# Patient Record
Sex: Female | Born: 1975 | Race: Black or African American | Hispanic: No | Marital: Single
Health system: Midwestern US, Academic
[De-identification: ages and names within clinical notes are randomized; demographics above are authoritative.]

---

## 2018-05-17 LAB — COMPREHENSIVE METABOLIC PANEL
Lab: 0.3
Lab: 1
Lab: 12
Lab: 139
Lab: 15
Lab: 23
Lab: 3.6
Lab: 60
Lab: 7.4
Lab: 79
Lab: 9.5
Lab: 93

## 2018-05-17 LAB — URIC ACID: Lab: 4.9

## 2018-05-17 LAB — C REACTIVE PROTEIN (CRP): Lab: 1.3 — ABNORMAL HIGH (ref 0.10–0.30)

## 2018-05-17 LAB — CBC: Lab: 8.3

## 2018-05-17 LAB — THYROID STIMULATING HORMONE-TSH: Lab: 1.1

## 2018-05-18 LAB — SED RATE: Lab: 59 — ABNORMAL HIGH (ref 0–20)

## 2018-05-18 LAB — D-DIMER: Lab: 532 — ABNORMAL HIGH (ref 0–400)

## 2018-05-18 LAB — ANTI-NUCLEAR ANTIBODY(ANA)
Lab: 1:8 {titer} — ABNORMAL HIGH
Lab: POSITIVE — AB

## 2018-05-18 LAB — C REACTIVE PROT-HI SENSITIVITY: Lab: 1 — ABNORMAL HIGH (ref 0.10–0.30)

## 2018-05-23 ENCOUNTER — Encounter: Admit: 2018-05-23 | Discharge: 2018-05-23 | Payer: BC Managed Care – PPO

## 2018-05-23 DIAGNOSIS — I10 Essential (primary) hypertension: ICD-10-CM

## 2018-05-23 DIAGNOSIS — R600 Localized edema: Principal | ICD-10-CM

## 2018-05-23 DIAGNOSIS — R0602 Shortness of breath: ICD-10-CM

## 2018-05-24 ENCOUNTER — Encounter: Admit: 2018-05-24 | Discharge: 2018-05-24 | Payer: BC Managed Care – PPO

## 2018-05-24 ENCOUNTER — Ambulatory Visit: Admit: 2018-05-24 | Discharge: 2018-05-25 | Payer: BC Managed Care – PPO

## 2018-05-24 DIAGNOSIS — R0602 Shortness of breath: Principal | ICD-10-CM

## 2018-05-24 DIAGNOSIS — I10 Essential (primary) hypertension: ICD-10-CM

## 2018-05-24 DIAGNOSIS — R600 Localized edema: ICD-10-CM

## 2018-05-24 NOTE — Progress Notes
Date of Service: 05/24/2018    Denise Wood is a 43 y.o. female.       HPI     I saw Denise Wood today regarding her exertional dyspnea.  She has had an extensive workup including an echocardiogram that was negative for pathology.  She had a venous duplex that was negative, and she even had a CT of the chest that showed some nodules, but no other abnormality.  She is under treatment for hypertension.  They did note on the CT that she had marked adenopathy in the mediastinum and hila, several scattered parenchymal nodules.  They even stated in their report that the possibilities for diagnosis would include lymphoma, infection, inflammatory processes.  I believe she is under investigation for that by her primary physician.     We are going to do a simple exercise stress test in regard to her findings, but I suspect they are not cardiac related unless her echocardiogram, under our auspices, finds something different than her previous study.  It is mostly this treadmill stress that I am most interested in, but many women will have a falsely positive stress electrocardiogram, and that is why we want to do it with echo imaging.  Her echo Doppler earlier with the [facility] doctors showed normal diastolic function, normal systolic function.  There was no estimation of pulmonary artery pressure.  There was no tricuspid valve jet.  The inferior vena cava though was normal by their report.     She is not having chest pain otherwise per se, but some of her symptoms appear to be both recumbent and exertional.  We will do the stress echo.  If that is negative, we would probably urge that her evaluation center on the lymphadenopathy and pulmonary nodules as stated on her CT scan.    ([id number])             Vitals:    05/24/18 1458 05/24/18 1506   BP: (!) 138/94 (!) 146/100   BP Source: Arm, Left Upper Arm, Right Upper   Pulse: 102    SpO2: 98%    Weight: 118.9 kg (262 lb 3.2 oz)    Height: 1.753 m (5' 9)    PainSc: Zero Body mass index is 38.72 kg/m???.     Past Medical History  Patient Active Problem List    Diagnosis Date Noted   ??? Lower extremity edema 05/23/2018     05/18/2018 Bil venous duplex  There is no evidence of deep venous thrombosis in either lower extremity      ??? Hypertension 05/23/2018     317/2020  Echo Normal LV systolic diastolic function.  No color flow or doppler evidence of hemodynamically significant valvular dysfunction.  Normal estimated PA pressure. IVC size and respiratory response are consistent with normal central venous pressure.      ??? Shortness of breath 05/23/2018         Review of Systems   Constitution: Negative.   HENT: Negative.    Eyes: Negative.    Cardiovascular: Positive for claudication, dyspnea on exertion and leg swelling.   Respiratory: Positive for shortness of breath.    Endocrine: Positive for polyuria.   Hematologic/Lymphatic: Negative.    Skin: Negative.    Musculoskeletal: Negative.    Gastrointestinal: Negative.    Genitourinary: Positive for frequency.   Neurological: Negative.    Psychiatric/Behavioral: Negative.    Allergic/Immunologic: Negative.        Physical Exam  Examination reveals a somewhat obese  lady in no acute distress.  She has no rhonchi, wheezing.  Jugular venous distention is normal.  Her ambulatory capability is normal.  She has only trace sock edema at the ankles, otherwise none.  Her balance is good.  There is no arcus senilis.  No cyanosis or clubbing.  No rashes.  Although she has been treated in the past for Graves disease, she is currently off medication.  She is also being evaluated for possible lupus or rheumatoid arthritis.    ([id number])        Cardiovascular Studies  Her 12-lead EKG shows normal sinus rhythm, borderline T-wave abnormalities, no infarction pattern, nondiagnostic Q-waves in the inferior leads.  PR interval is 164 milliseconds, heart rate 102 beats per minute.  Blood pressure is mildly elevated.  She is on hydrochlorothiazide

## 2018-08-01 ENCOUNTER — Encounter: Admit: 2018-08-01 | Discharge: 2018-08-01 | Payer: BC Managed Care – PPO

## 2018-08-01 DIAGNOSIS — R0602 Shortness of breath: Principal | ICD-10-CM

## 2018-08-01 DIAGNOSIS — R079 Chest pain, unspecified: ICD-10-CM

## 2018-08-10 ENCOUNTER — Encounter: Admit: 2018-08-10 | Discharge: 2018-08-10

## 2018-08-10 ENCOUNTER — Ambulatory Visit: Admit: 2018-08-10 | Discharge: 2018-08-11

## 2018-08-10 DIAGNOSIS — R0602 Shortness of breath: Secondary | ICD-10-CM

## 2018-08-10 DIAGNOSIS — R0789 Other chest pain: Secondary | ICD-10-CM

## 2018-08-16 ENCOUNTER — Encounter: Admit: 2018-08-16 | Discharge: 2018-08-16

## 2018-08-16 NOTE — Telephone Encounter
-----   Message from Paticia Stack, RN sent at 08/16/2018 10:12 AM CDT -----    ----- Message -----  From: Shan Levans, MD  Sent: 08/16/2018  10:02 AM CDT  To: Binnie Kand, RN    Please let pt know this test is normal.

## 2018-08-16 NOTE — Telephone Encounter
Results and recommendations called to patient lmom requested call back if questions

## 2018-08-16 NOTE — Telephone Encounter
-----   Message from Laura Knippa, RN sent at 08/16/2018 10:12 AM CDT -----    ----- Message -----  From: Rosamond, Thomas L, MD  Sent: 08/16/2018  10:02 AM CDT  To: Diane Kovich, RN    Please let pt know this test is normal.

## 2019-07-24 IMAGING — CR CHEST
2 series · 2 of 2 positions shown · non-contrast
Comparison: none

[chest pa]
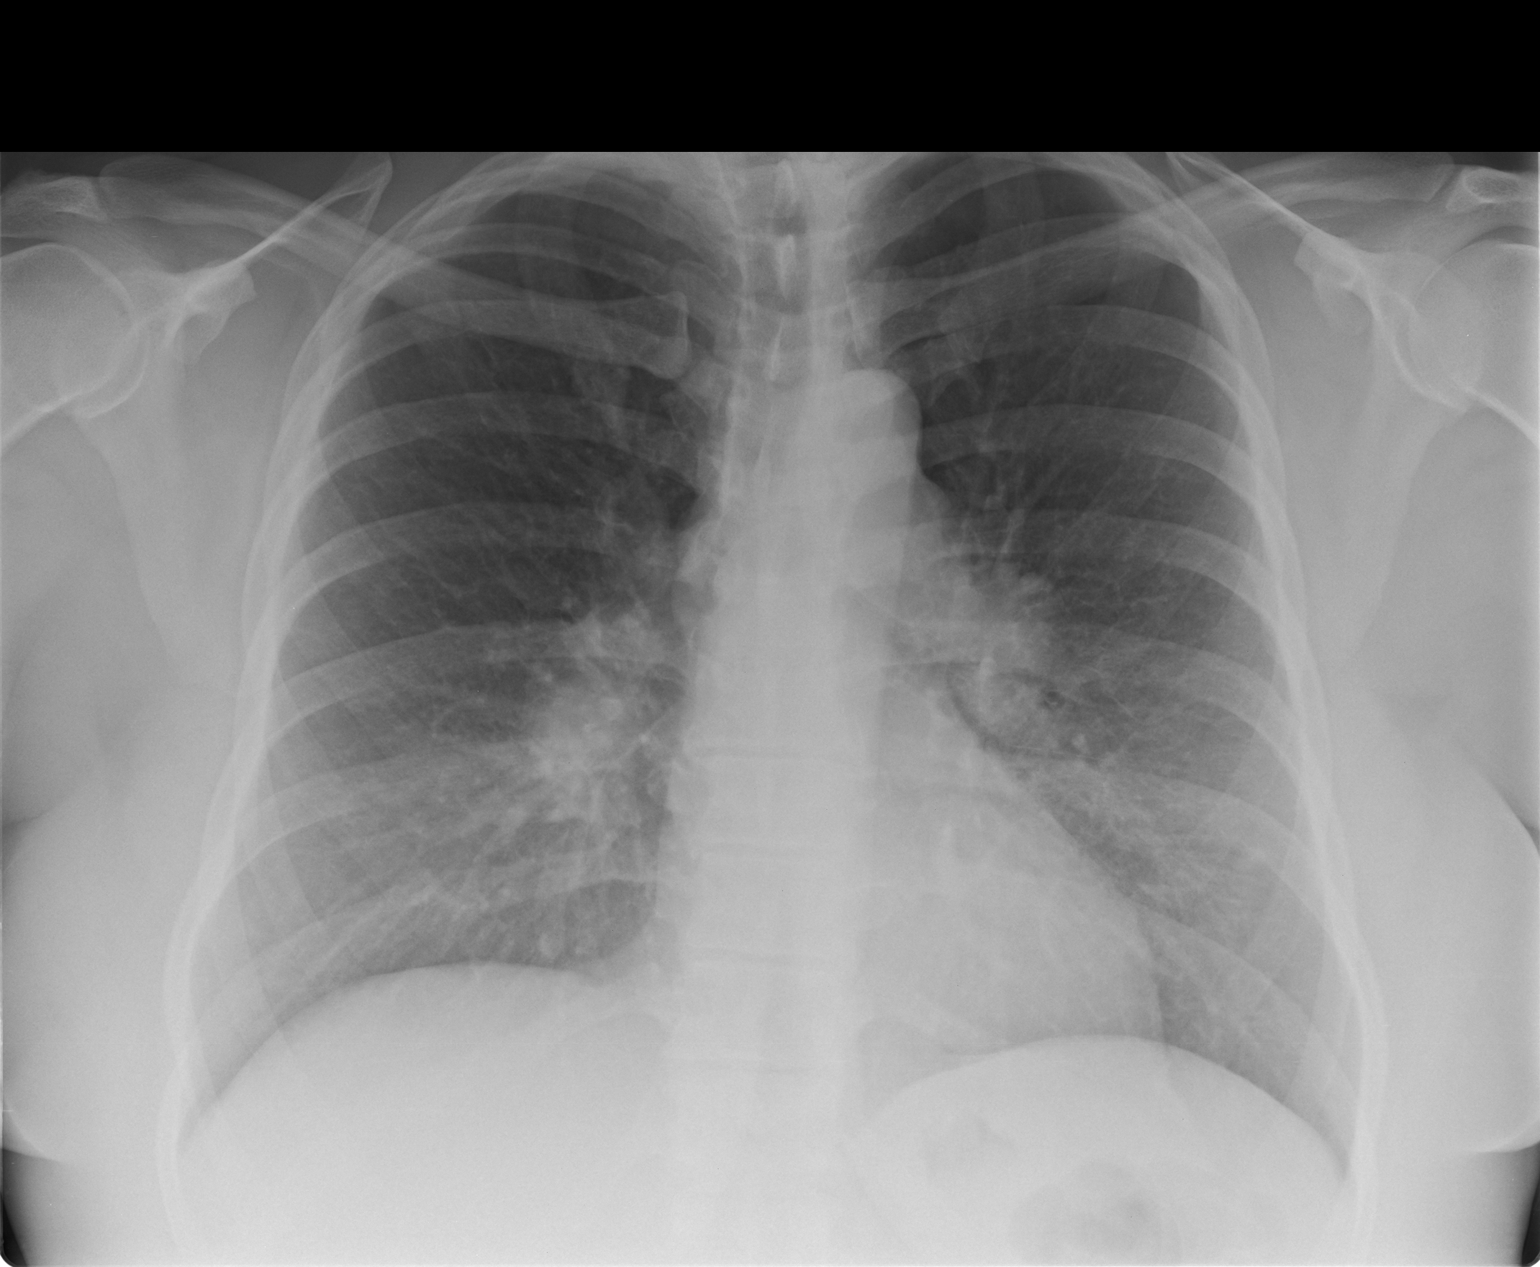

[chest lat]
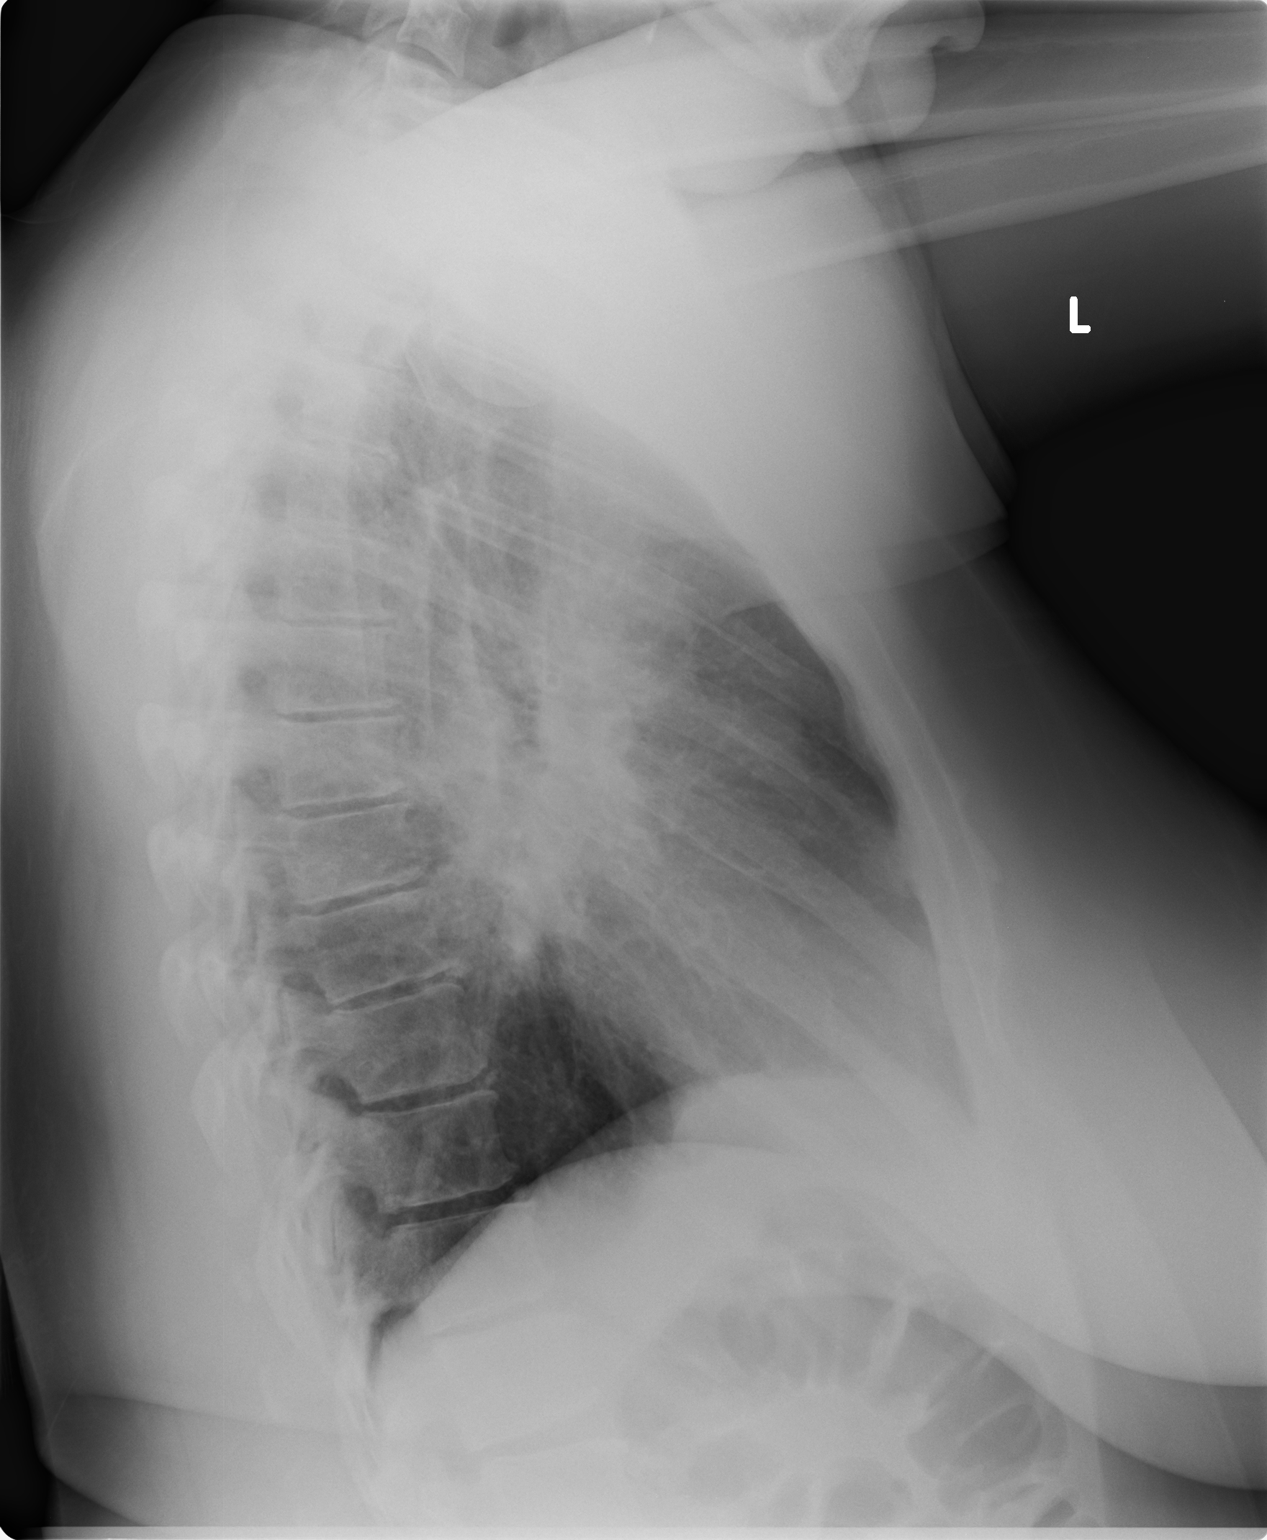

[2 of 2 positions shown; findings below may reference images not displayed]

DIAGNOSTIC STUDIES

EXAM

RADIOLOGICAL EXAMINATION, CHEST; 2 VIEWS FRONTAL AND LATERAL CPT 01565

INDICATION

cough x 1 month
cough x 2 months. ME

TECHNIQUE

2 views of the chest were acquired.

COMPARISONS

Chest CT with contrast May 18, 2018.

FINDINGS

The cardiac silhouette is within normal limits for size. The mediastinum is not widened or
deviated. The lungs are clear and the costophrenic sulci are sharp. Pulmonary vasculature is normal
caliber. No pneumothorax is identified. Bulky hilar and mediastinal lymphadenopathy re-demonstrated,
consider sarcoidosis.

IMPRESSION

Lungs are clear without consolidation, pleural effusion, or pneumothorax. Bulky bilateral hilar
lymphadenopathy re-demonstrated.

Tech Notes:

cough x 2 months. ME

## 2021-11-17 IMAGING — MG MAMMOGRAM 3D SCREEN, BILATERAL
10 series · 10 of 10 positions shown · non-contrast
Comparison: none

[R CC]
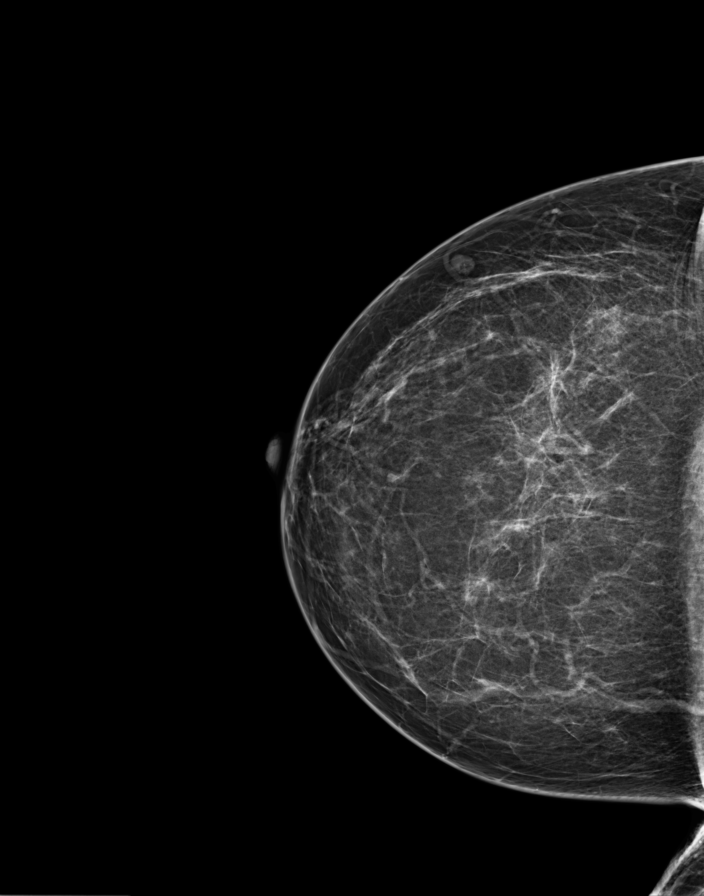

[R tomo (1 of 3)]
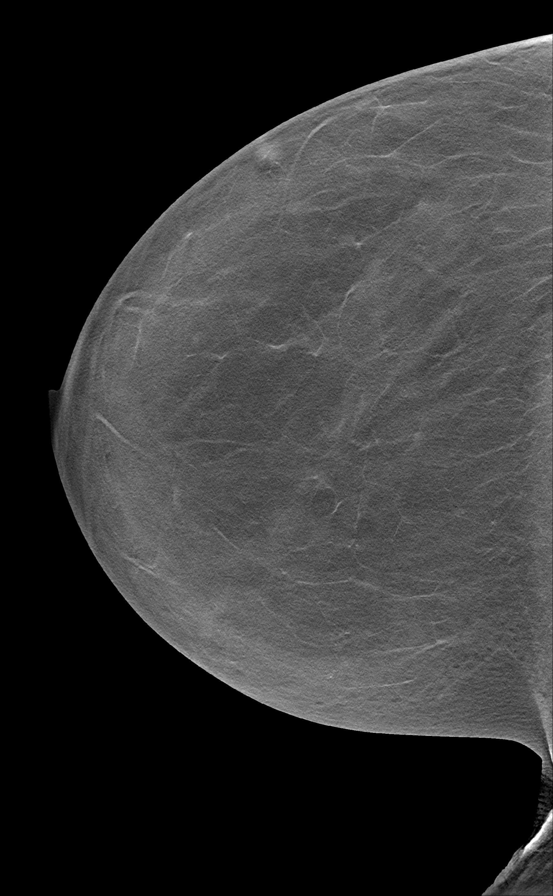

[L CC]
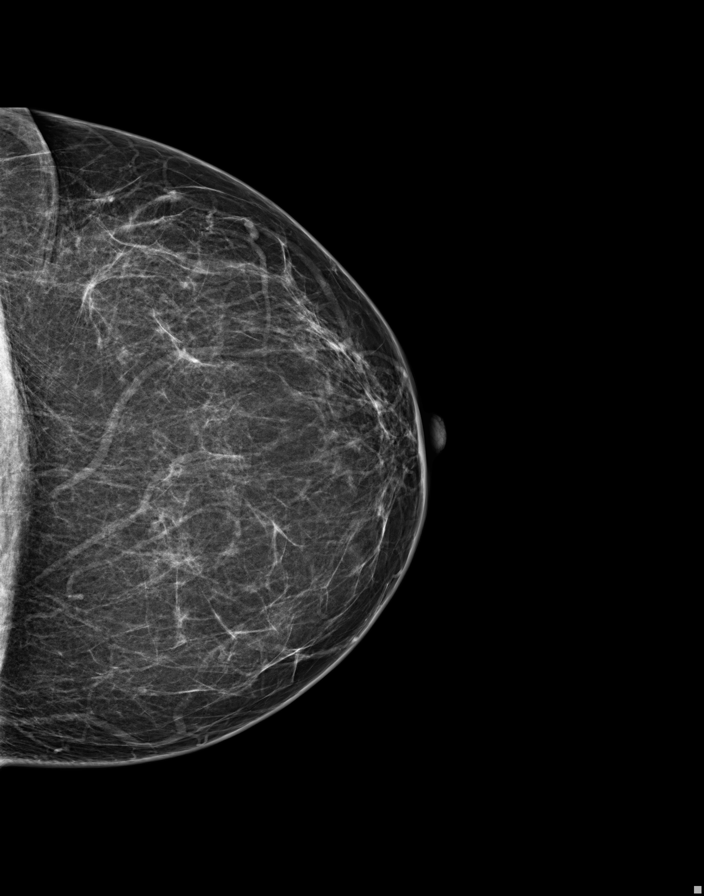

[L tomo (1 of 2)]
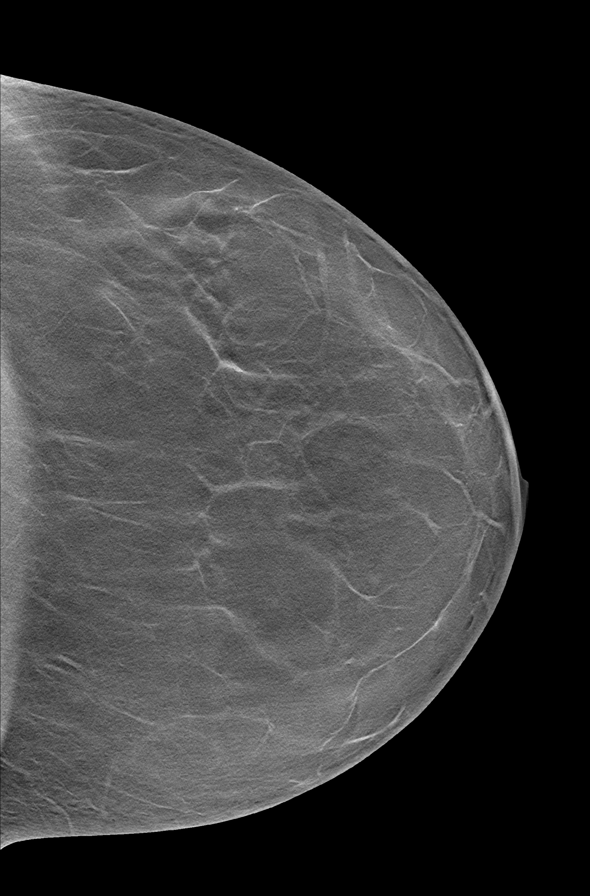

[L MLO]
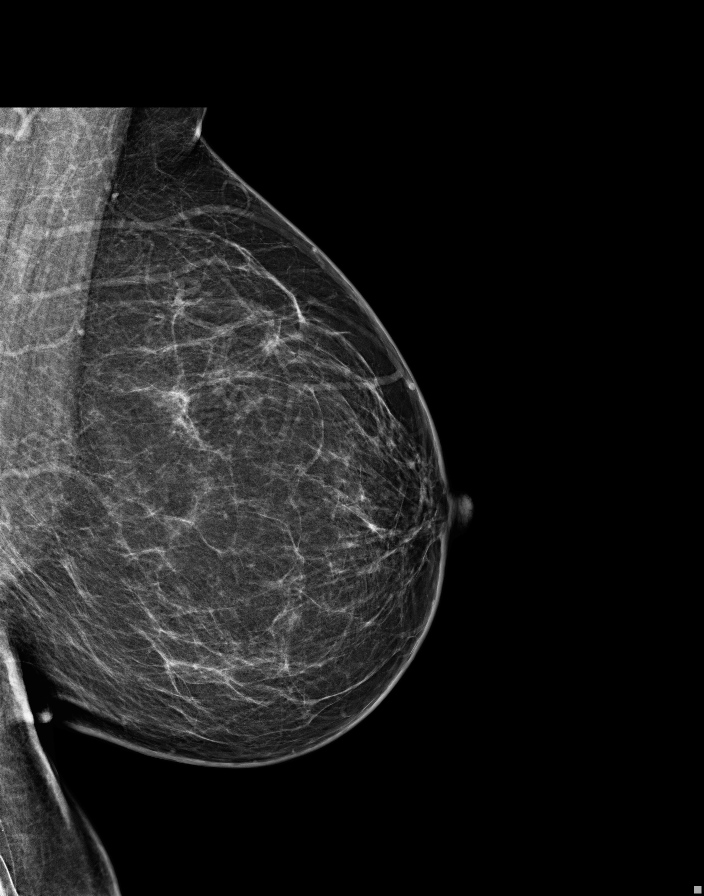

[L tomo (2 of 2)]
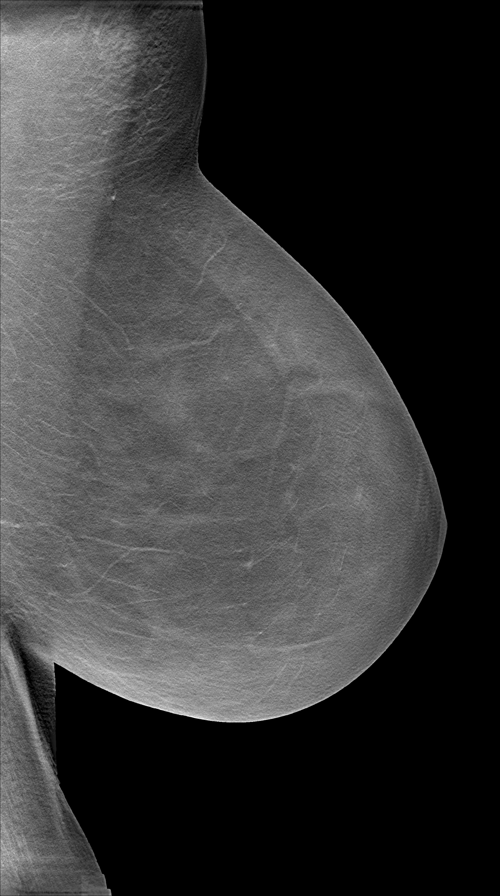

[R MLO (1 of 2)]
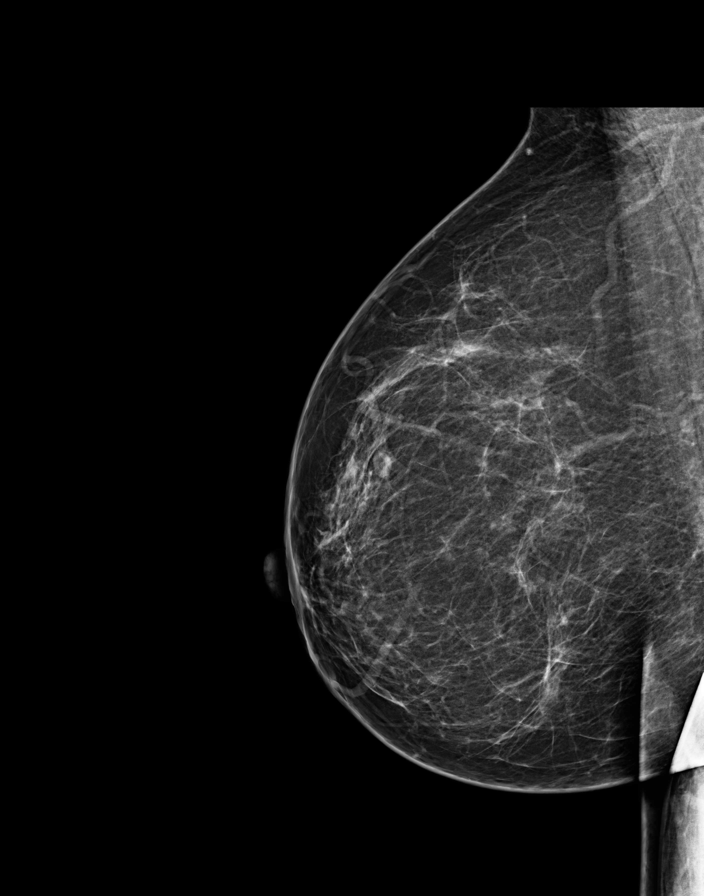

[R tomo (2 of 3)]
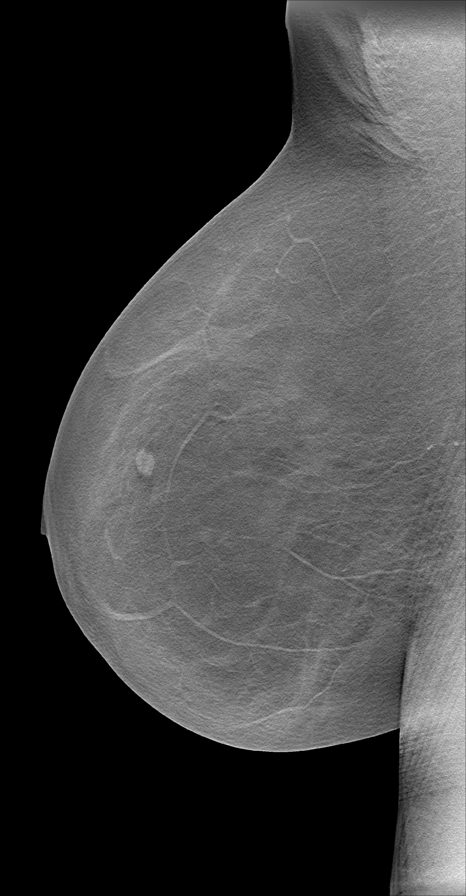

[R MLO (2 of 2)]
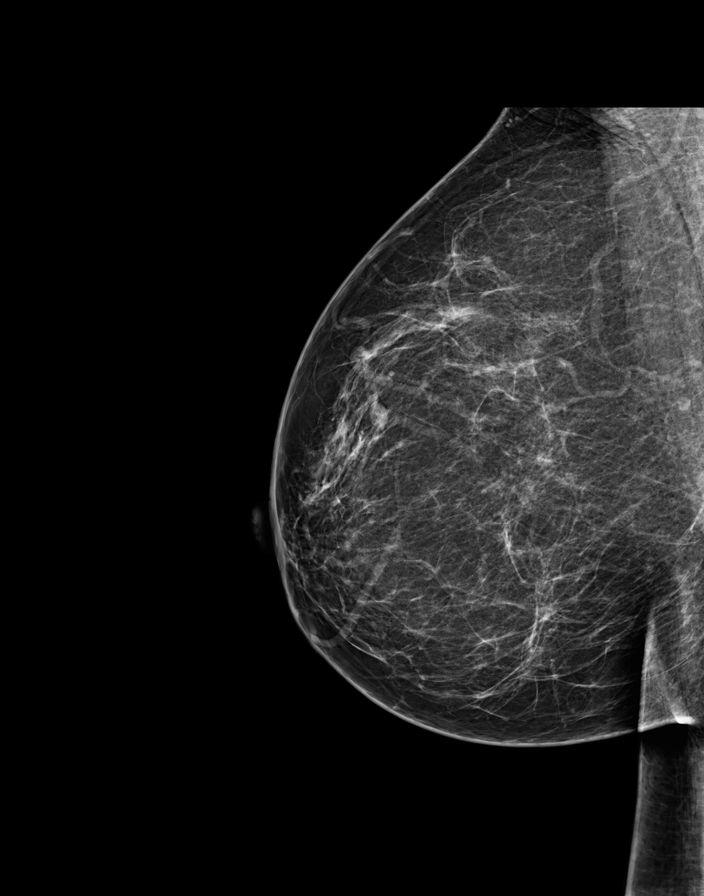

[R tomo (3 of 3)]
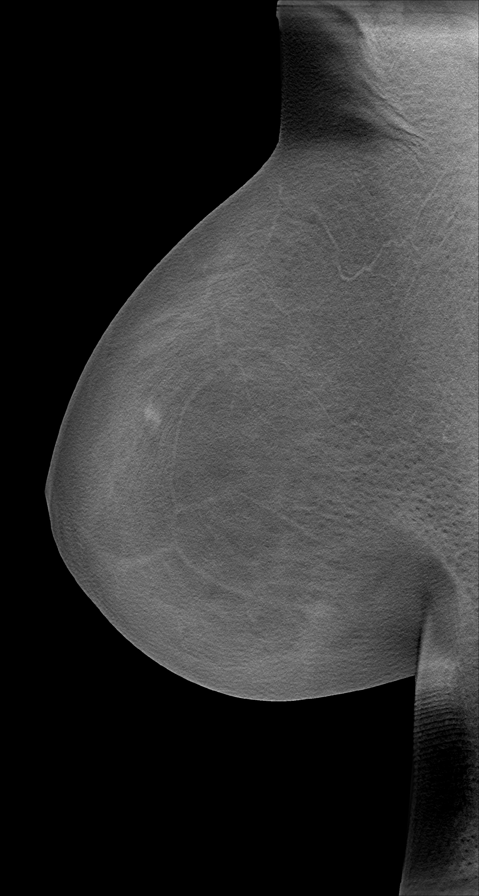

[10 of 10 positions shown; findings below may reference images not displayed]

EXAM

MAMMOGRAM

INDICATION

ATH screening
SCREENING. KF 3D. PRIORS 0663 HERE

TECHNIQUE

2D and tomosynthesis digital craniocaudal and mediolateral oblique views were obtained of both
breasts. Computer aided detection software was utilized.

COMPARISONS

12/27/2016.

FINDINGS

There is scattered fibroglandular tissue.

[There is no suspicious microcalcification, architectural distortion, or spiculated mass.]

IMPRESSION

BI-RADS 1, NEGATIVE

A reminder letter will be sent.

Tech Notes:
